# Patient Record
Sex: Male | Born: 1980 | Race: White | Hispanic: No | Marital: Single
Health system: Southern US, Community
[De-identification: ages and names within clinical notes are randomized; demographics above are authoritative.]

---

## 2009-05-29 ENCOUNTER — Emergency Department: Payer: Self-pay | Admitting: Emergency Medicine

## 2009-08-26 ENCOUNTER — Ambulatory Visit: Payer: Self-pay | Admitting: Family Medicine

## 2009-09-24 ENCOUNTER — Ambulatory Visit: Payer: Self-pay | Admitting: Family Medicine

## 2011-05-24 IMAGING — CT CT HEAD WITHOUT CONTRAST
2 series · 16 of 30 positions shown, 20 images · non-contrast
Comparison: none

REASON FOR EXAM: headache/shunt
COMMENTS:

[Series 2: without · axial · non-contrast · 0.46mm/px · z∈[-156,-31]mm · 13 of 31 slices shown, 17 images]
[im 3/31  brain]
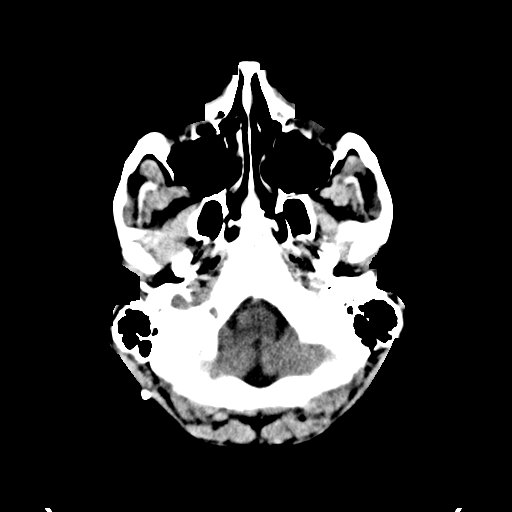
[im 3/31  bone]
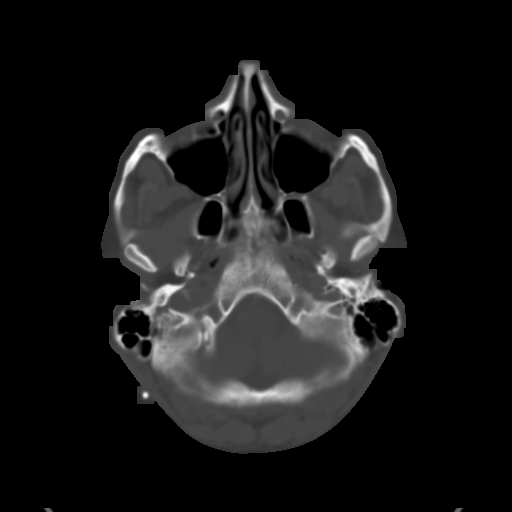
[im 5/31  brain]
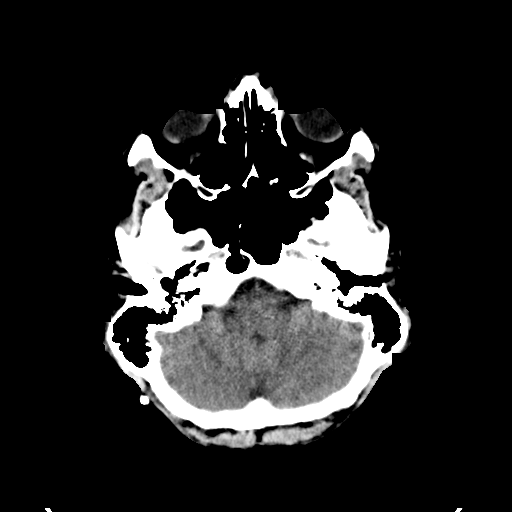
[im 7/31  brain]
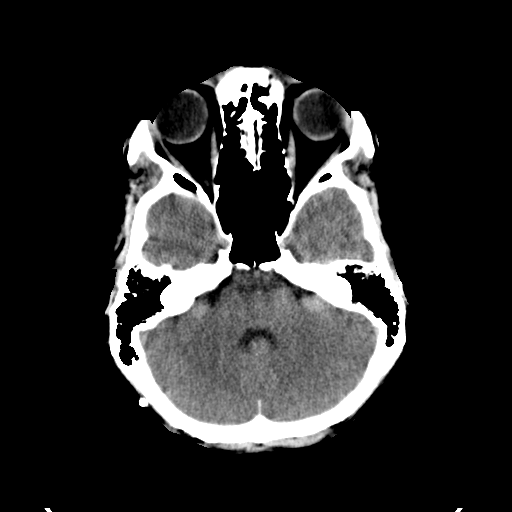
[im 9/31  brain]
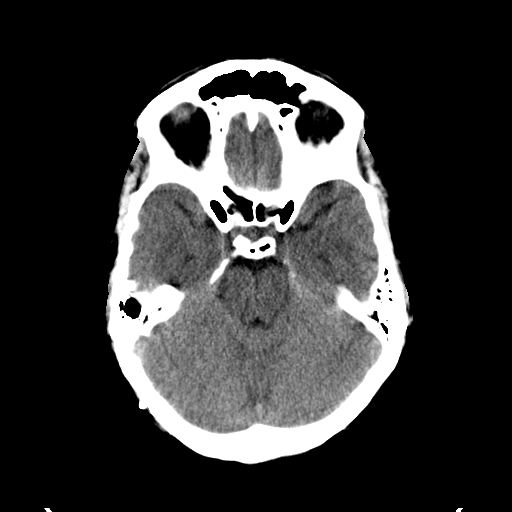
[im 11/31  brain]
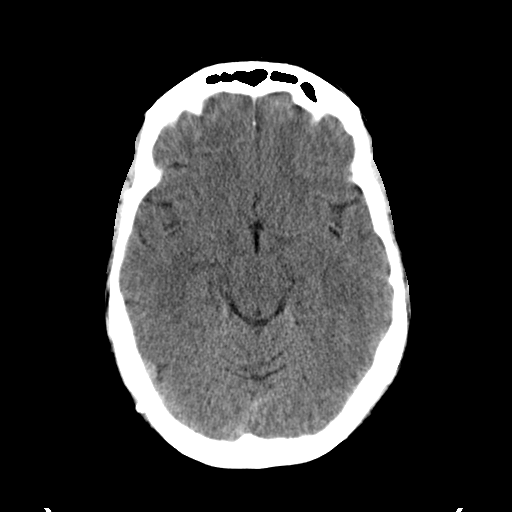
[im 11/31  bone]
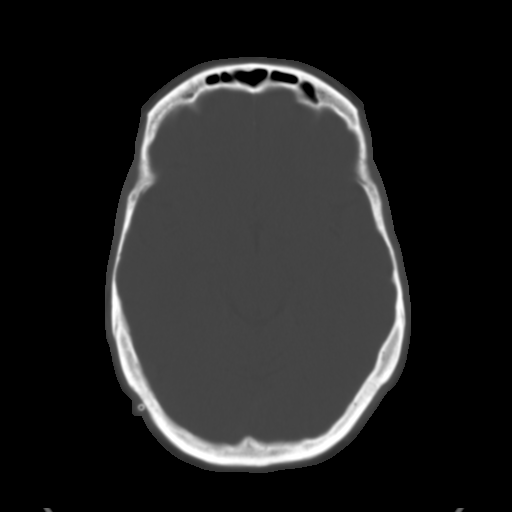
[im 13/31  brain]
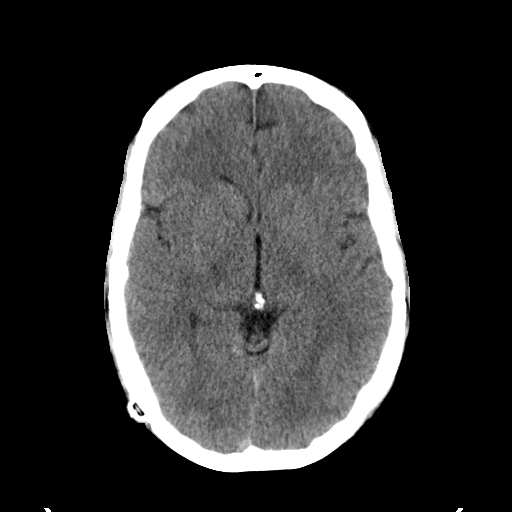
[im 16/31  brain]
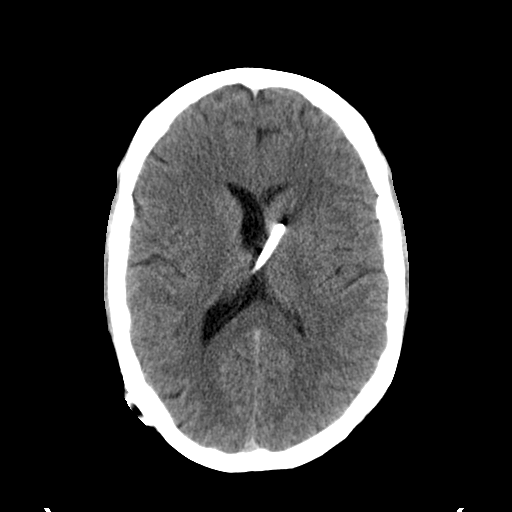
[im 18/31  brain]
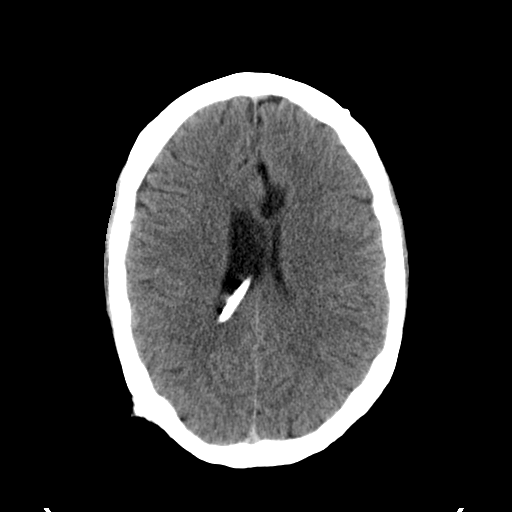
[im 20/31  brain]
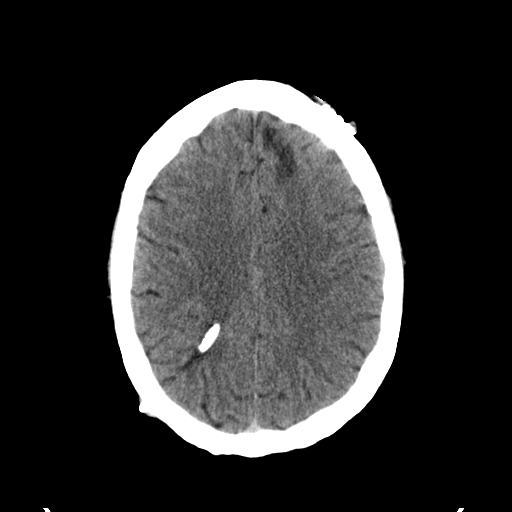
[im 20/31  bone]
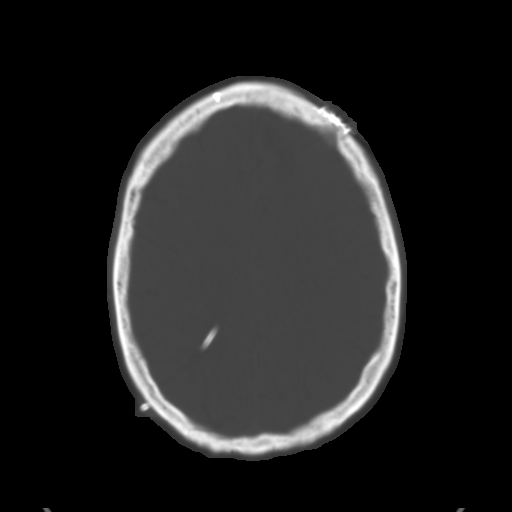
[im 22/31  brain]
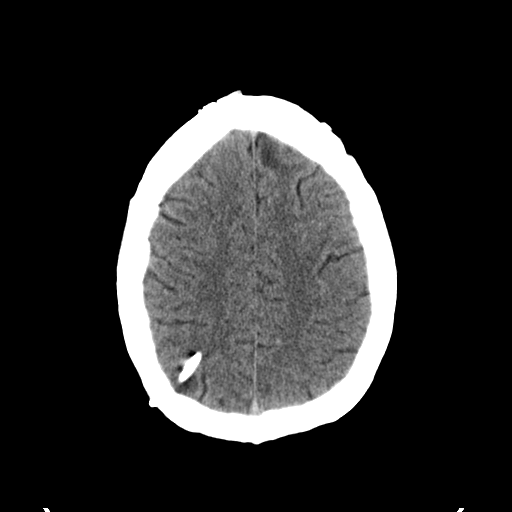
[im 24/31  brain]
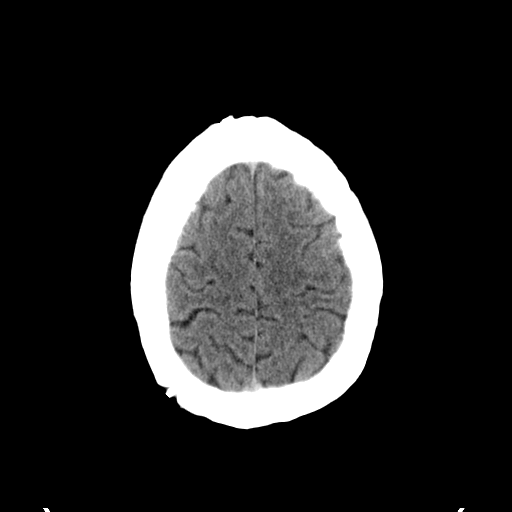
[im 26/31  brain]
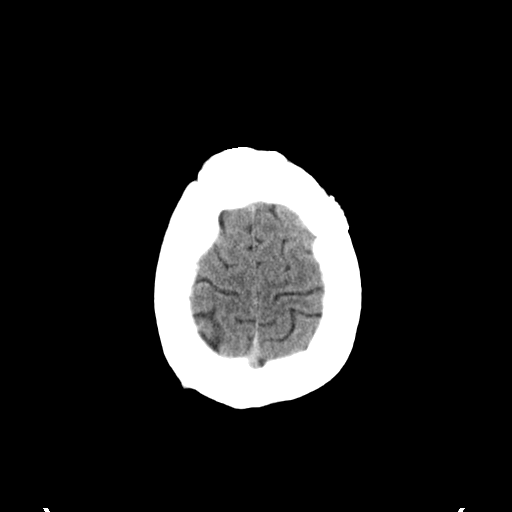
[im 28/31  brain]
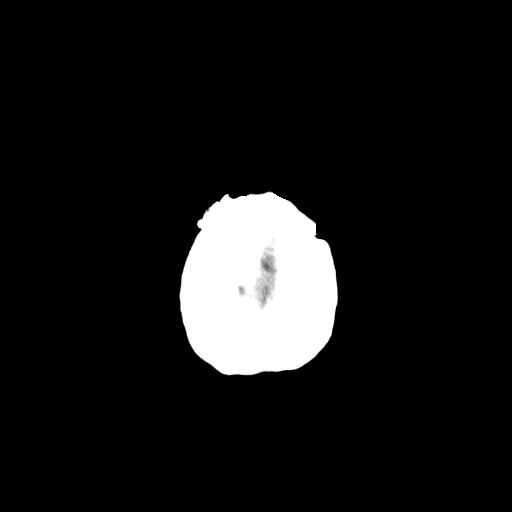
[im 28/31  bone]
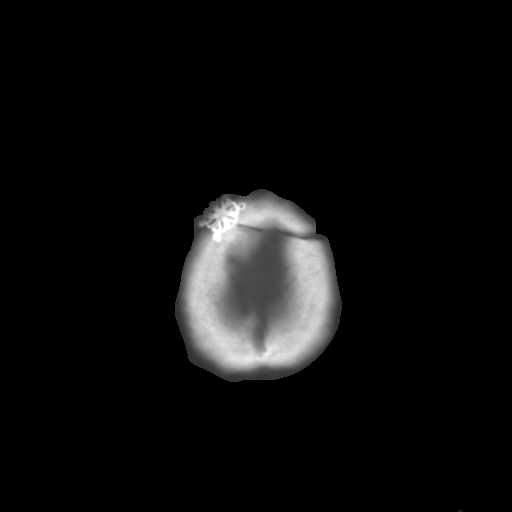

[Series 3: bone · axial · 0.46mm/px · z∈[-156,-116]mm · 3 of 31 slices shown]
[im 3/31  bone]
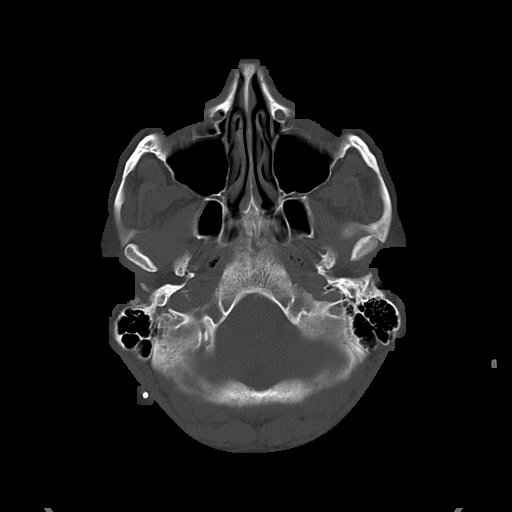
[im 7/31  bone]
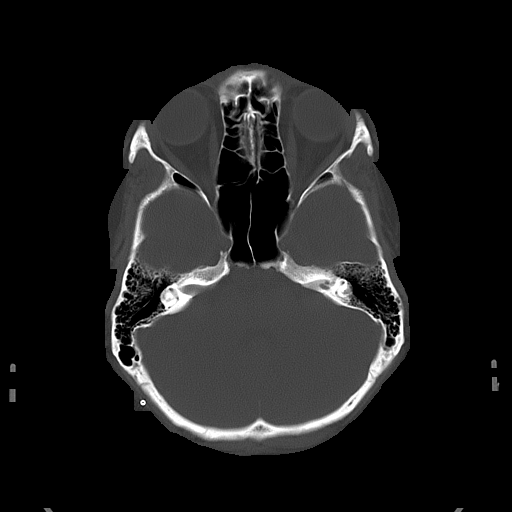
[im 11/31  bone]
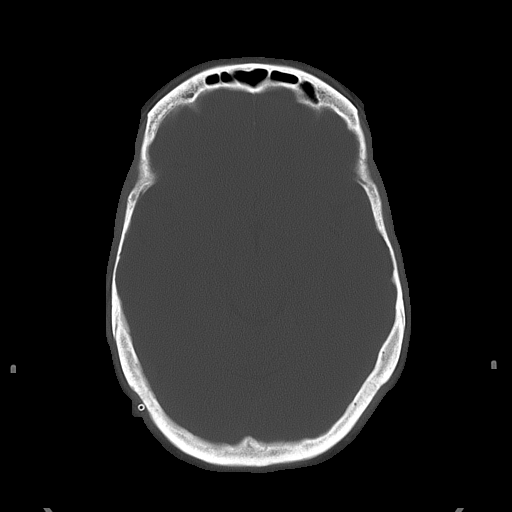

[16 of 30 positions shown; findings below may reference images not displayed]

PROCEDURE:     CT  - CT HEAD WITHOUT CONTRAST  - May 29, 2009  [DATE]

RESULT:     Standard nonenhanced head CT was obtained. No prior exams are
available for comparison. A ventricular drainage tube is noted. This
originates in the right posterior parietal region and terminates in the left
basal ganglia. There is decompression of the left lateral ventricle. Mild
prominence of the right lateral ventricle is present. No prominent
hydrocephalus, however, is present. The temporal horns are unremarkable. No
mass lesion no hemorrhage. No acute bony abnormality. Craniotomy defects are
present. Left frontal encephalomalacia noted.
IMPRESSION: No acute abnormalities. See complete discussion above. Report was
communicated with the patient's physician at time of study.

## 2011-09-19 IMAGING — CR DG CHEST 2V
1 series · 2 of 2 positions shown · non-contrast
Comparison: none

REASON FOR EXAM: follow up pneumonia
COMMENTS:

[Series 1: view not recorded · 0.17mm/px · 2 of 2 slices shown]
[im 1/2]
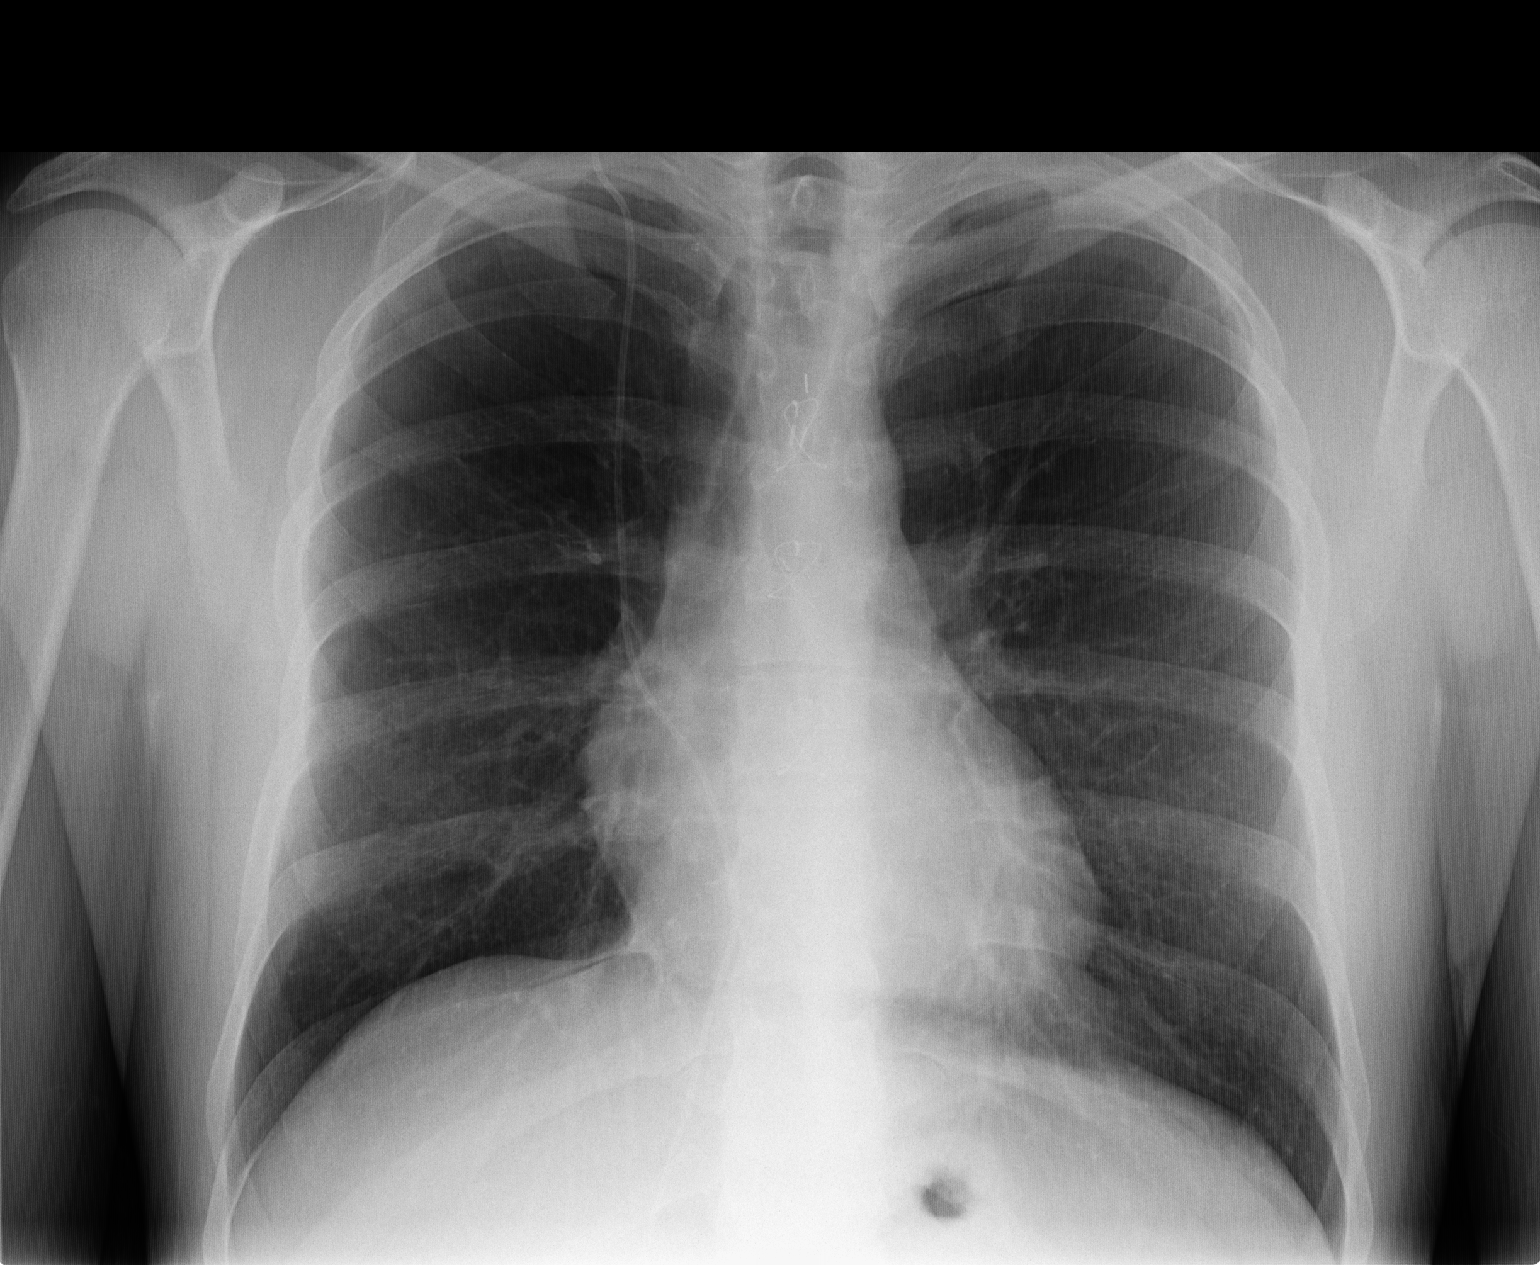
[im 2/2]
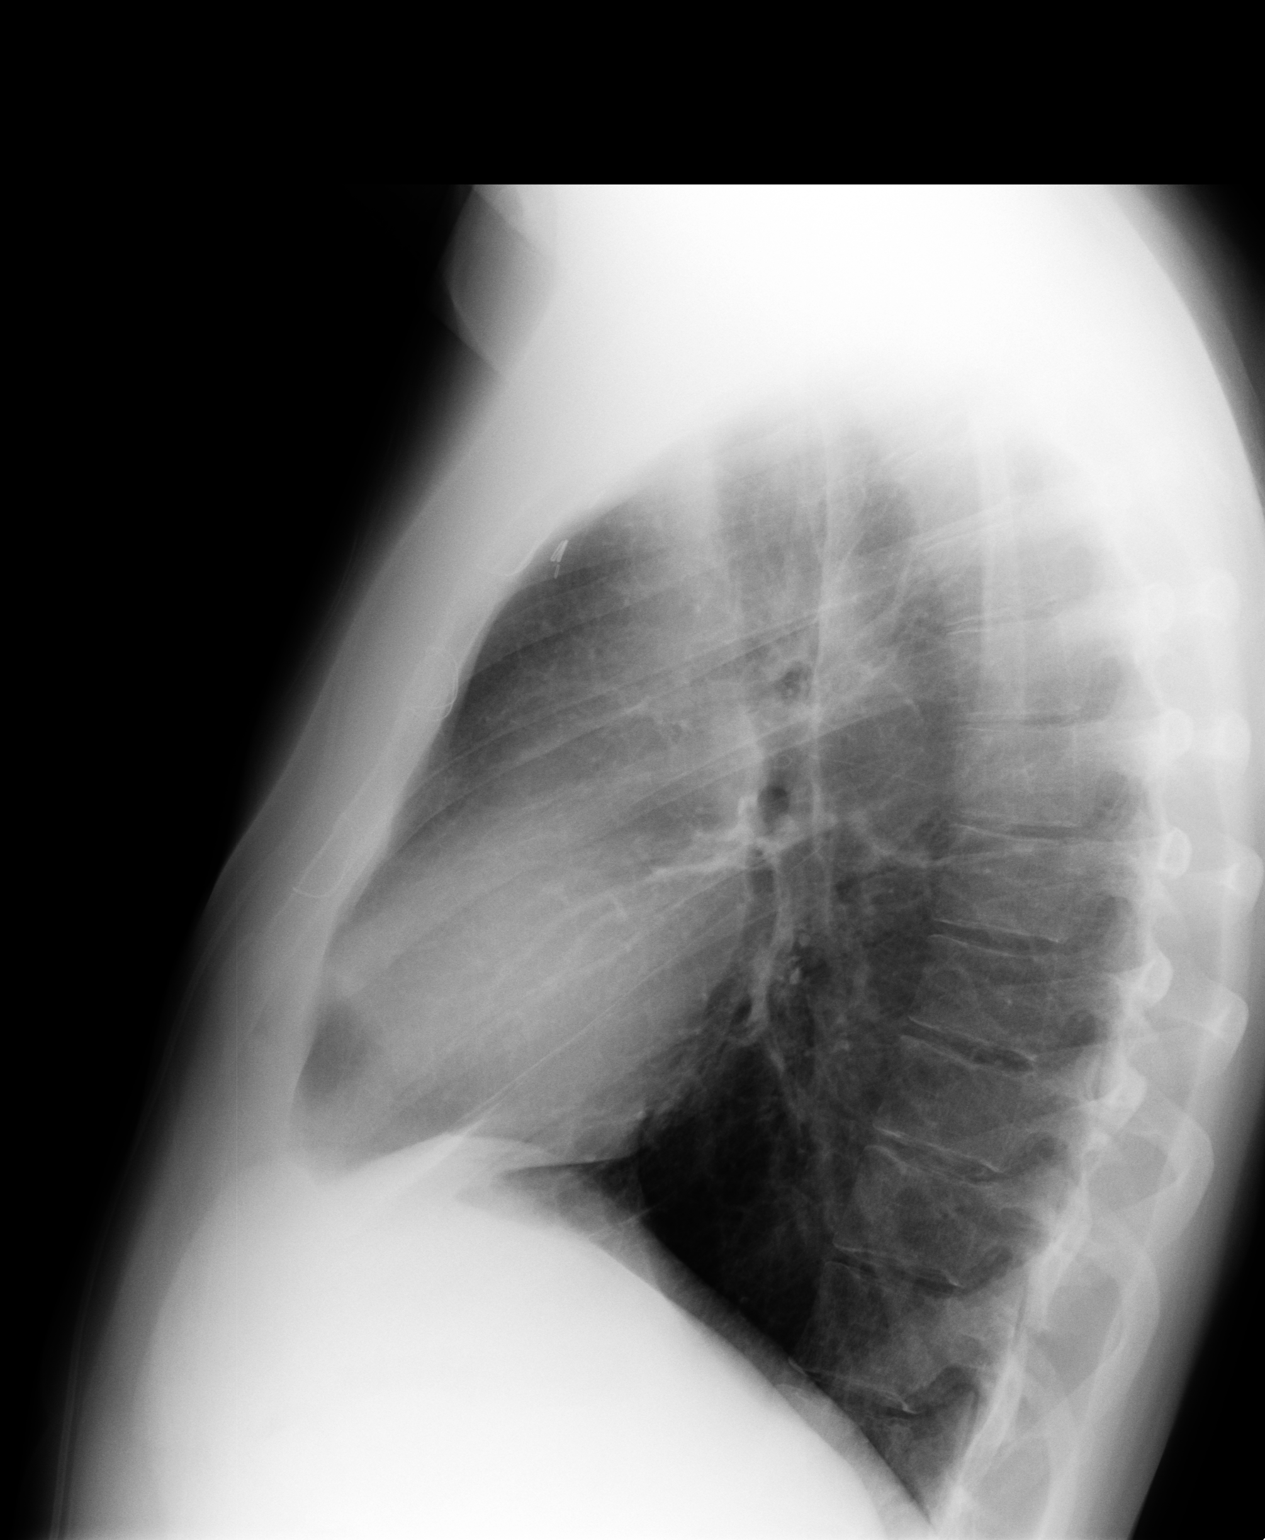

[2 of 2 positions shown; findings below may reference images not displayed]

PROCEDURE:     KDR - KDXR CHEST PA (OR AP) AND LAT  - September 24, 2009  [DATE]

RESULT:     Comparison is made to a prior exam of 08/26/2009. The bilateral
pulmonary infiltrates present on the prior exam have now cleared. No new
infiltrative changes are seen. Heart size is normal. A ventriculoperitoneal
catheter is present.
IMPRESSION: 1. The lung fields are clear. The previously present bilateral pulmonary
infiltrates are no longer seen.
2. No new infiltrates are identified.
3. Heart size is normal.
4. Ventriculoperitoneal catheter is again noted.

## 2012-01-07 ENCOUNTER — Ambulatory Visit: Payer: Self-pay | Admitting: Surgery

## 2012-01-07 LAB — CBC WITH DIFFERENTIAL/PLATELET
Eosinophil #: 0.5 10*3/uL (ref 0.0–0.7)
HCT: 46.9 % (ref 40.0–52.0)
HGB: 15.7 g/dL (ref 13.0–18.0)
Lymphocyte #: 1.9 10*3/uL (ref 1.0–3.6)
Lymphocyte %: 16.2 %
MCH: 30.1 pg (ref 26.0–34.0)
MCHC: 33.4 g/dL (ref 32.0–36.0)
Monocyte %: 8.6 %
Neutrophil #: 8.1 10*3/uL — ABNORMAL HIGH (ref 1.4–6.5)
Neutrophil %: 69.6 %
Platelet: 249 10*3/uL (ref 150–440)
RDW: 15.2 % — ABNORMAL HIGH (ref 11.5–14.5)
WBC: 11.7 10*3/uL — ABNORMAL HIGH (ref 3.8–10.6)

## 2012-01-07 LAB — BASIC METABOLIC PANEL
Anion Gap: 7 (ref 7–16)
BUN: 12 mg/dL (ref 7–18)
Calcium, Total: 9.5 mg/dL (ref 8.5–10.1)
EGFR (African American): >60
EGFR (Non-African Amer.): >60
Glucose: 81 mg/dL (ref 65–99)
Osmolality: 273 (ref 275–301)
Potassium: 3.7 mmol/L (ref 3.5–5.1)
Sodium: 137 mmol/L (ref 136–145)

## 2012-01-15 ENCOUNTER — Ambulatory Visit: Payer: Self-pay | Admitting: Surgery

## 2012-01-19 LAB — PATHOLOGY REPORT

## 2013-12-08 ENCOUNTER — Ambulatory Visit: Payer: Self-pay | Admitting: Emergency Medicine

## 2014-08-28 NOTE — Op Note (Signed)
PATIENT NAME:  Theodore Kelly, Theodore Kelly MR#:  [id number] DATE OF BIRTH:  August 18, 1980  DATE OF PROCEDURE:  01/15/2012  PREOPERATIVE DIAGNOSIS: Recurrent left thigh mass.    POSTOPERATIVE DIAGNOSIS: Recurrent left thigh mass.    PROCEDURE: Excisional biopsy of a recurrent left thigh mass.   SURGEON: Scheryl Sanborn E. Excell Seltzerooper, MD  ANESTHESIA: General with LMA.   INDICATIONS: This is a patient with a large recurrent and growing left thigh mass causing pain. Preoperatively we discussed rationale for surgery, the options of observation, risk of bleeding, infection, recurrence, and cosmetic deformity including open wound and scar and wound dehiscence. He understood and agreed to proceed. This was all reviewed for him in the preop holding area.   FINDINGS: 4 cm recurrent sebaceous cyst excised via a 10 cm intermediate closed longitudinal incision.   DESCRIPTION OF PROCEDURE: Patient was induced to general anesthesia, placed in a well-padded supine position with bump padding beneath the left buttock. He was prepped and draped in sterile fashion. A lenticular type incision was utilized to draw out a planned incision incorporating the recurrent mass as well has the ill placed transverse incision scar from previous excision.   Local anesthetic was infiltrated in skin and subcutaneous tissues for a total of 30 mL and then this lenticular-shaped incision was executed with sharp dissection. It was found that this mass contained sebum suggestive of a recurrent sebaceous cyst. It was sent off for examination after measuring it at 4 cm.   Hemostasis was with electrocautery. Two arterial feeders were suture ligated with 3-0 Vicryl figure-of-eight sutures and then once assuring that hemostasis was adequate undermining was performed minimally to allow for the skin edges to approximate. Again, hemostasis was found to be adequate and maintained with electrocautery then multiple layers of deep sutures of 3-0 Vicryl were utilized to  approximate skin edges deeply and then a layer of interrupted horizontal mattress sutures of 2-0 nylon and simple sutures of 2-0 nylon were placed and a sterile bacitracin dressing was placed.   Patient tolerated the procedure well. There were no complications. He was taken to the recovery room in stable condition to be discharged in care of was family. Follow up in 10 days.  ____________________________ Adah Salvageichard E. Excell Seltzerooper, MD rec:cms D: 01/15/2012 09:49:15 ET T: 01/15/2012 13:22:39 ET JOB#: [id number]  cc: Adah Salvageichard E. Excell Seltzerooper, MD, <Dictator>  Lattie HawICHARD E Savahna Casados MD ELECTRONICALLY SIGNED 01/16/2012 8:15

## 2014-08-28 NOTE — H&P (Signed)
PATIENT NAME:  Theodore Kelly, Theodore Kelly MR#:  [id number] DATE OF BIRTH:  10-22-1980  DATE OF ADMISSION:  01/15/2012  CHIEF COMPLAINT: Recurrent left leg mass.   HISTORY OF PRESENT ILLNESS: This is a patient who had a prior excision of a left leg mass in 2008. It recurred and is growing. He was told before it was a benign cyst but it is causing him some pain. He is taking tramadol now. He has not had any drainage but it is certainly a recurrence in the same area beneath the scar.   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 He takes no medications at this time.  2. Hyperlipidemia. 3. Anxiety disorder.  4. History of pneumonia.  5. Hypertension. 6. Depression.    ALLERGIES: None.   MEDICATIONS: Herbal drugs and tramadol.   FAMILY HISTORY: Noncontributory.   SOCIAL HISTORY: Patient drinks alcohol occasionally. He smokes cigars, has been a heavy smoker in the past. He is employed but divorced.   REVIEW OF SYSTEMS: 10 system review is performed and documented in the office chart.   PHYSICAL EXAMINATION:  VITAL SIGNS: Healthy-appearing Caucasian male patient.   HEENT: Right-sided VP shunt which is palpable in the right postauricular area. No palpable neck nodes. No scleral icterus.   CHEST: Clear to auscultation.   CARDIAC: Regular rate and rhythm. There is a sternotomy scar and chest tube scars.   ABDOMEN: Soft and nontender with the exception of a right upper quadrant scar it is otherwise normal.   NEUROLOGIC: Grossly intact.   INTEGUMENT: No jaundice.   EXTREMITIES: In the left lateral thigh is a nontender. 4 cm hyperemic mass with a transverse scar present and well healed.   LABORATORY, DIAGNOSTIC AND RADIOLOGICAL DATA: Records are reviewed from [place] [place] laboratory where he had this excised in the past and it was called an epidermal inclusion cyst at that time.   Hemoglobin and hematocrit 15.7 and 47% with a 249 platelet count.    Electrolytes are within normal limits.   ASSESSMENT AND PLAN: This is a patient with a recurrent left lateral thigh mass. It was a benign inclusion cyst on biopsy previously but it does not feel like an inclusion cyst at this point. He has a large transverse scar which will need to be excised. The rationale for surgery has been discussed with him, the options of observation have been reviewed and the risks of bleeding, infection, recurrence, and cosmetic deformity including an open wound were all reviewed for him. He understood and agreed to proceed.  ____________________________ Adah Salvageichard E. Excell Seltzerooper, MD rec:cms D: 01/14/2012 20:20:50 ET T: 01/15/2012 06:39:04 ET JOB#: [id number]  cc: Adah Salvageichard E. Excell Seltzerooper, MD, <Dictator>  Lattie HawICHARD E Jemya Depierro MD ELECTRONICALLY SIGNED 01/15/2012 8:12

## 2021-01-05 ENCOUNTER — Ambulatory Visit: Admit: 2021-01-05 | Disposition: A | Discharge status: Home / Self Care | Payer: Self-pay

## 2023-01-10 DIAGNOSIS — Z419 Encounter for procedure for purposes other than remedying health state, unspecified: Secondary | ICD-10-CM | POA: Diagnosis not present

## 2023-02-09 DIAGNOSIS — Z419 Encounter for procedure for purposes other than remedying health state, unspecified: Secondary | ICD-10-CM | POA: Diagnosis not present

## 2023-02-12 DIAGNOSIS — H5212 Myopia, left eye: Secondary | ICD-10-CM | POA: Diagnosis not present

## 2023-02-12 DIAGNOSIS — H5213 Myopia, bilateral: Secondary | ICD-10-CM | POA: Diagnosis not present

## 2023-03-12 DIAGNOSIS — Z419 Encounter for procedure for purposes other than remedying health state, unspecified: Secondary | ICD-10-CM | POA: Diagnosis not present

## 2023-11-05 ENCOUNTER — Other Ambulatory Visit: Payer: Self-pay | Admitting: Internal Medicine

## 2023-11-05 DIAGNOSIS — M5116 Intervertebral disc disorders with radiculopathy, lumbar region: Secondary | ICD-10-CM

## 2023-12-07 ENCOUNTER — Other Ambulatory Visit: Payer: Self-pay | Admitting: Family Medicine

## 2023-12-07 DIAGNOSIS — M5416 Radiculopathy, lumbar region: Secondary | ICD-10-CM

## 2023-12-15 NOTE — Discharge Instructions (Signed)

## 2023-12-17 ENCOUNTER — Ambulatory Visit
Admission: RE | Admit: 2023-12-17 | Discharge: 2023-12-17 | Disposition: A | Discharge status: Home / Self Care | Source: Ambulatory Visit | Attending: Family Medicine | Admitting: Family Medicine

## 2023-12-17 DIAGNOSIS — M5416 Radiculopathy, lumbar region: Secondary | ICD-10-CM

## 2023-12-17 MED ORDER — IOPAMIDOL (ISOVUE-M 200) INJECTION 41%
1.0000 mL | Freq: Once | INTRAMUSCULAR | Status: AC
  Administered 2023-12-17: 1 mL via EPIDURAL

## 2023-12-17 MED ORDER — METHYLPREDNISOLONE ACETATE 40 MG/ML INJ SUSP (RADIOLOG
80.0000 mg | Freq: Once | INTRAMUSCULAR | Status: AC
  Administered 2023-12-17: 80 mg via EPIDURAL

## 2024-01-18 ENCOUNTER — Other Ambulatory Visit: Payer: Self-pay | Admitting: Family Medicine

## 2024-01-18 DIAGNOSIS — M5416 Radiculopathy, lumbar region: Secondary | ICD-10-CM

## 2024-03-22 NOTE — Discharge Instructions (Signed)

## 2024-03-24 ENCOUNTER — Ambulatory Visit
Admission: RE | Admit: 2024-03-24 | Discharge: 2024-03-24 | Disposition: A | Discharge status: Home / Self Care | Source: Ambulatory Visit | Attending: Family Medicine | Admitting: Family Medicine

## 2024-03-24 DIAGNOSIS — M5416 Radiculopathy, lumbar region: Secondary | ICD-10-CM

## 2024-03-24 MED ORDER — METHYLPREDNISOLONE ACETATE 40 MG/ML INJ SUSP (RADIOLOG
80.0000 mg | Freq: Once | INTRAMUSCULAR | Status: AC
  Administered 2024-03-24: 80 mg via EPIDURAL

## 2024-03-24 MED ORDER — IOPAMIDOL (ISOVUE-M 200) INJECTION 41%
1.0000 mL | Freq: Once | INTRAMUSCULAR | Status: AC
  Administered 2024-03-24: 1 mL via EPIDURAL
